# Patient Record
Sex: Male | Born: 1990 | Race: Asian | Hispanic: No | Marital: Single
Health system: Southern US, Community
[De-identification: ages and names within clinical notes are randomized; demographics above are authoritative.]

---

## 2019-02-27 ENCOUNTER — Other Ambulatory Visit: Payer: Self-pay | Admitting: Family Medicine

## 2019-02-27 DIAGNOSIS — R748 Abnormal levels of other serum enzymes: Secondary | ICD-10-CM

## 2019-03-02 ENCOUNTER — Other Ambulatory Visit: Payer: Self-pay | Admitting: Family Medicine

## 2019-03-07 ENCOUNTER — Other Ambulatory Visit: Payer: Self-pay | Admitting: Family Medicine

## 2019-03-09 ENCOUNTER — Ambulatory Visit
Admission: RE | Admit: 2019-03-09 | Discharge: 2019-03-09 | Disposition: A | Discharge status: Home / Self Care | Payer: BLUE CROSS/BLUE SHIELD | Source: Ambulatory Visit | Attending: Family Medicine | Admitting: Family Medicine

## 2019-03-09 DIAGNOSIS — R748 Abnormal levels of other serum enzymes: Secondary | ICD-10-CM

## 2019-09-14 ENCOUNTER — Other Ambulatory Visit: Payer: Self-pay | Admitting: Family Medicine

## 2019-09-14 DIAGNOSIS — R748 Abnormal levels of other serum enzymes: Secondary | ICD-10-CM

## 2019-09-25 ENCOUNTER — Ambulatory Visit
Admission: RE | Admit: 2019-09-25 | Discharge: 2019-09-25 | Disposition: A | Discharge status: Home / Self Care | Payer: BLUE CROSS/BLUE SHIELD | Source: Ambulatory Visit | Attending: Family Medicine | Admitting: Family Medicine

## 2019-09-25 DIAGNOSIS — R748 Abnormal levels of other serum enzymes: Secondary | ICD-10-CM

## 2019-12-27 ENCOUNTER — Ambulatory Visit: Payer: BLUE CROSS/BLUE SHIELD | Attending: Family

## 2019-12-27 DIAGNOSIS — Z23 Encounter for immunization: Secondary | ICD-10-CM

## 2019-12-27 NOTE — Progress Notes (Signed)
   Covid-[facility] Vaccination [facility]  Name:  Damian Buckles    MRN: [id number] DOB: 09-14-1991  12/27/2019  Mr. [name] was observed post Covid-19 immunization for 15 minutes without incident. He was provided with Vaccine Information Sheet and instruction to access the V-Safe system.   Mr. Bamba was instructed to call 911 with any severe reactions post vaccine: Marland Kitchen Difficulty breathing  . Swelling of face and throat  . A fast heartbeat  . A bad rash all over body  . Dizziness and weakness   Immunizations Administered    Name Date Dose VIS Date Route   Moderna COVID-19 Vaccine 12/27/2019  1:42 PM 0.5 mL 09/11/2019 Intramuscular   Manufacturer: Moderna   Lot: [id number]   NDC: 66294-765-46

## 2020-01-29 ENCOUNTER — Ambulatory Visit: Payer: BLUE CROSS/BLUE SHIELD | Attending: Family

## 2020-01-29 DIAGNOSIS — Z23 Encounter for immunization: Secondary | ICD-10-CM

## 2020-01-29 NOTE — Progress Notes (Signed)
   Covid-19 Vaccination Clinic  Name:  Adrian Barnes    MRN: [id number] DOB: November 17, 1990  01/29/2020  Mr. [name] was observed post Covid-19 immunization for 15 minutes without incident. He was provided with Vaccine Information Sheet and instruction to access the V-Safe system.   Mr. [name] was instructed to call 911 with any severe reactions post vaccine: Marland Kitchen Difficulty breathing  . Swelling of face and throat  . A fast heartbeat  . A bad rash all over body  . Dizziness and weakness   Immunizations Administered    Name Date Dose VIS Date Route   Moderna COVID-19 Vaccine 01/29/2020 11:44 AM 0.5 mL 09/2019 Intramuscular   Manufacturer: Moderna   Lot: [id number]   NDC: 97989-211-94

## 2020-06-01 IMAGING — US ULTRASOUND ABDOMEN LIMITED
1 series · 14 of 25 positions shown · non-contrast
Comparison: None.

CLINICAL DATA: Elevated LFTs

EXAM:
ULTRASOUND ABDOMEN LIMITED RIGHT UPPER QUADRANT

[Series 1: ultrasound abdomen limited · 0.20mm/px · 14 of 46 slices shown]
[im 1/46]
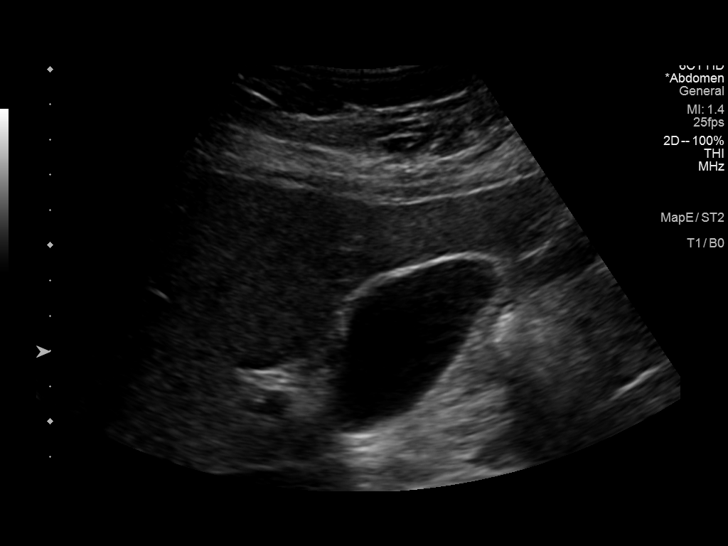
[im 4/46]
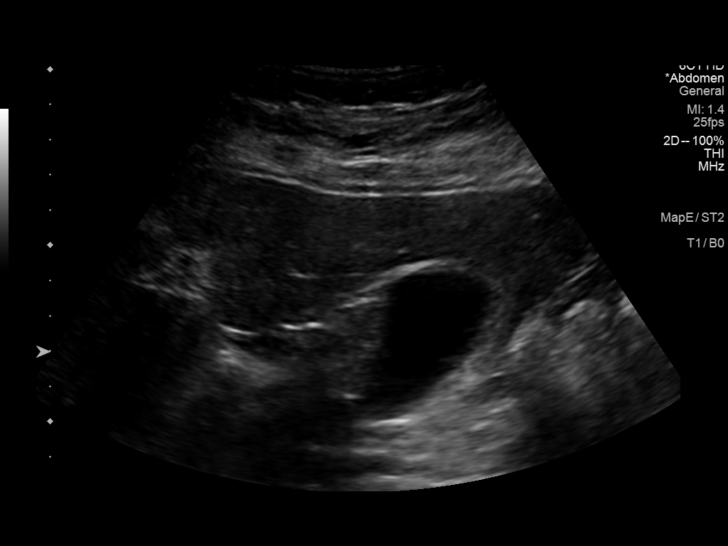
[im 8/46]
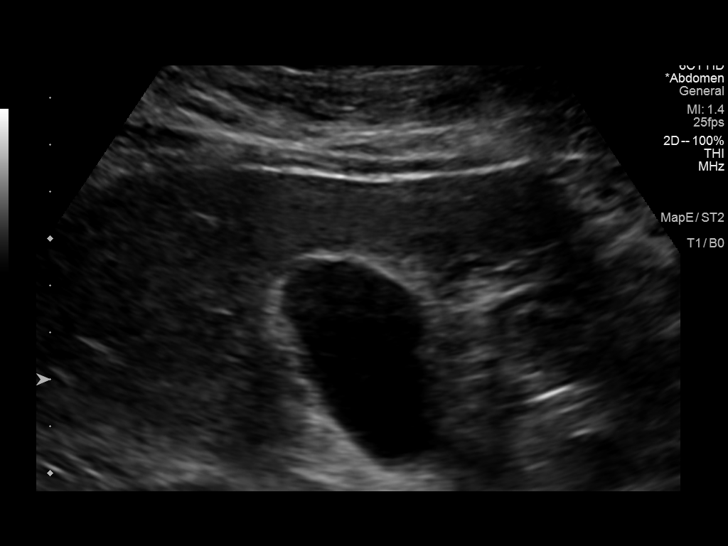
[im 12/46]
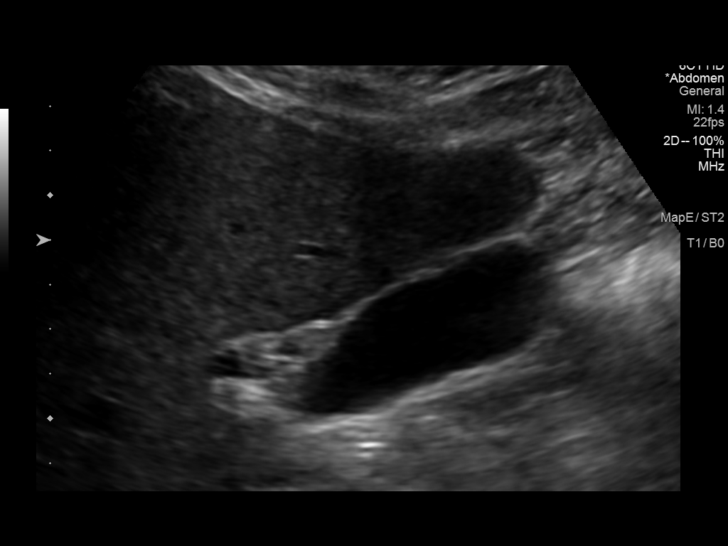
[im 16/46]
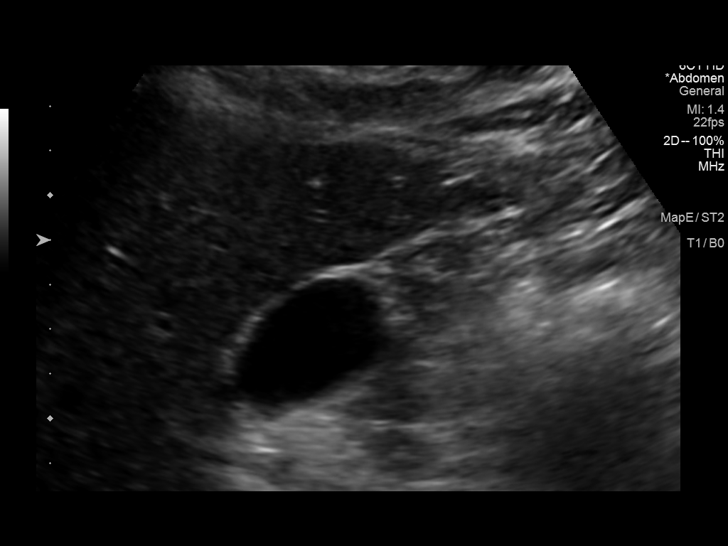
[im 17/46]
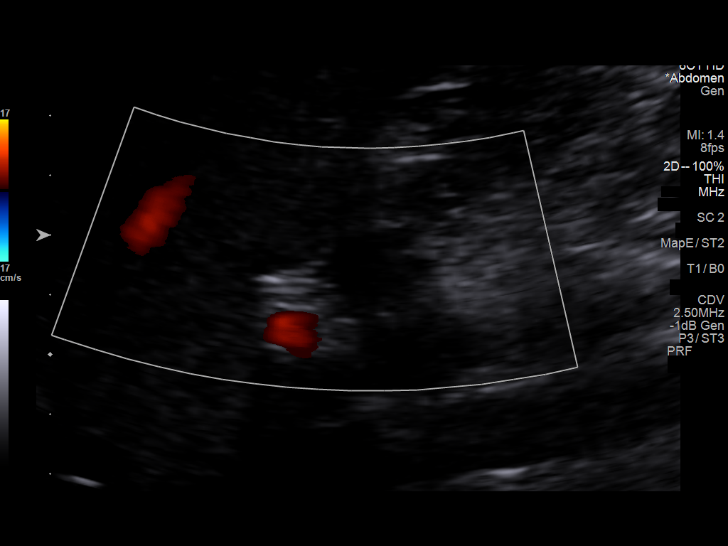
[im 21/46]
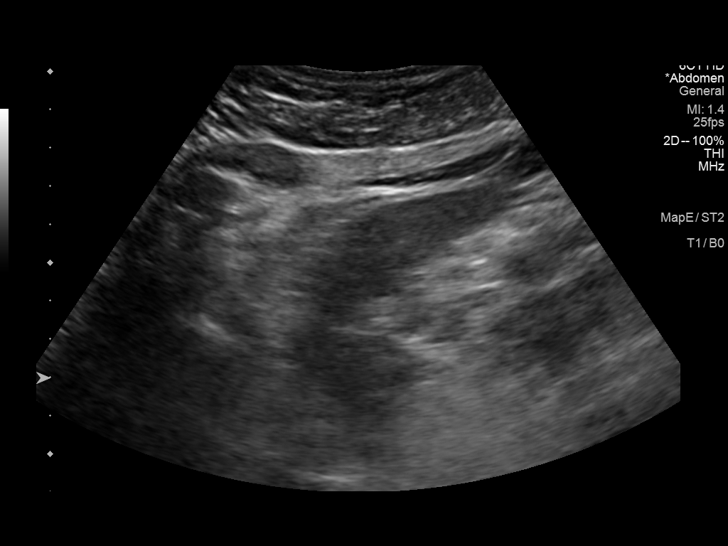
[im 25/46]
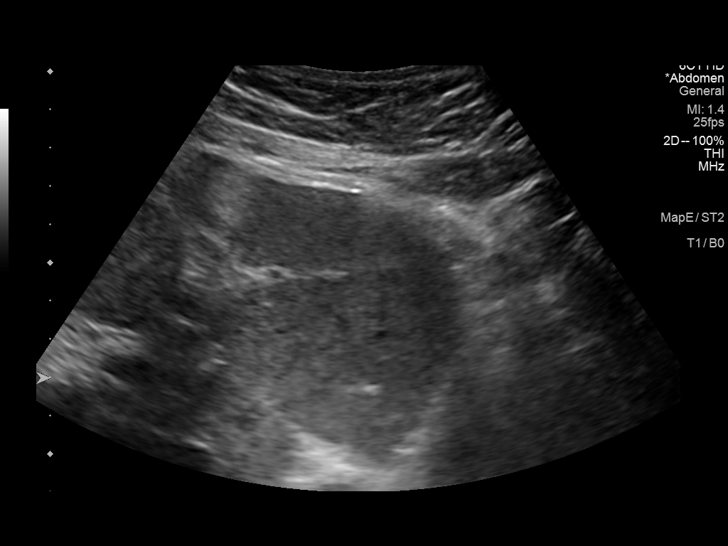
[im 29/46]
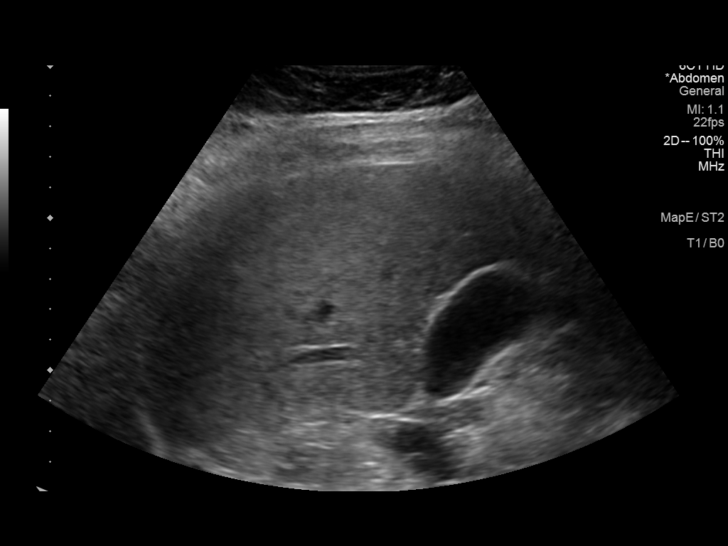
[im 31/46]
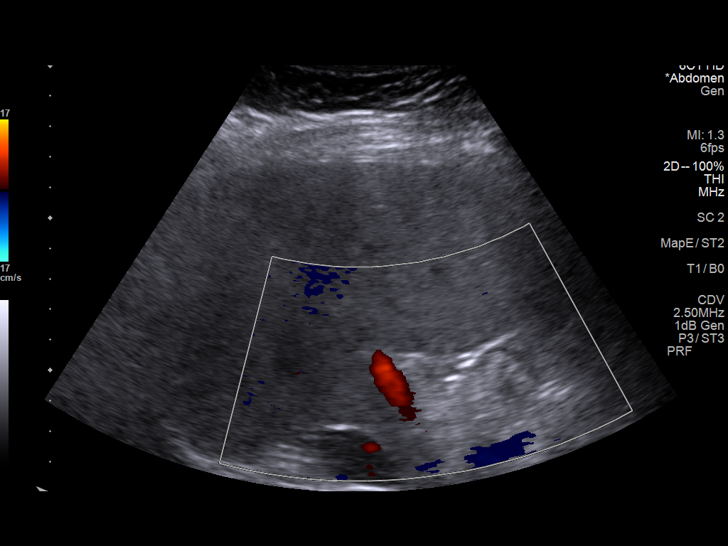
[im 34/46]
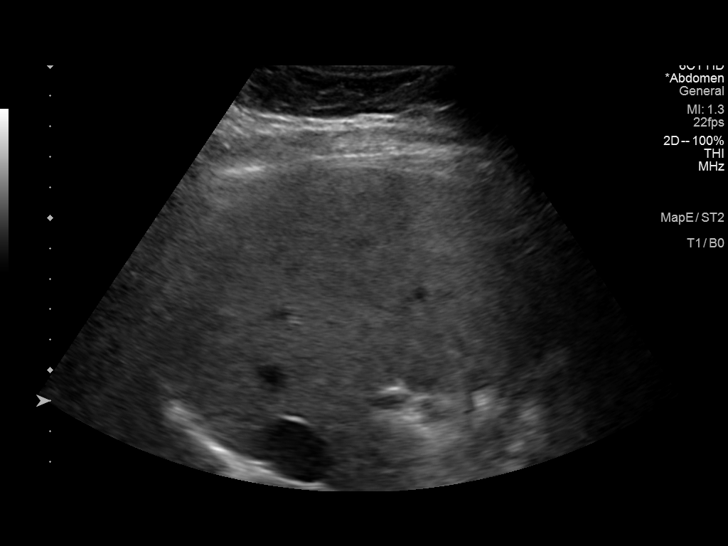
[im 38/46]
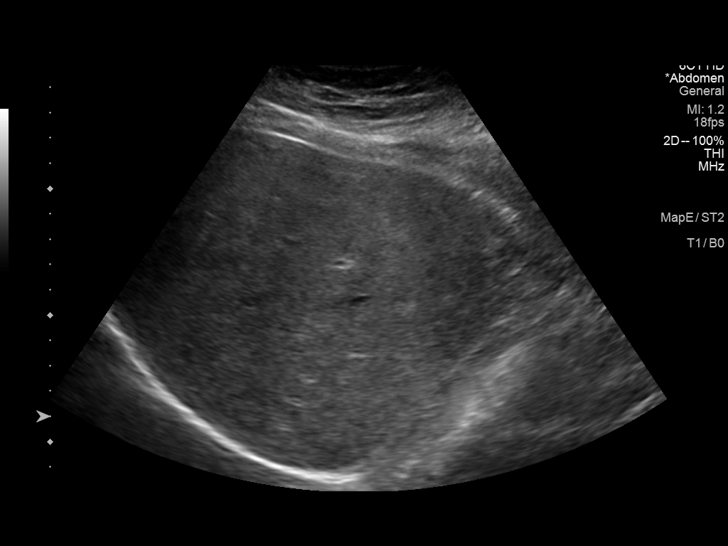
[im 42/46]
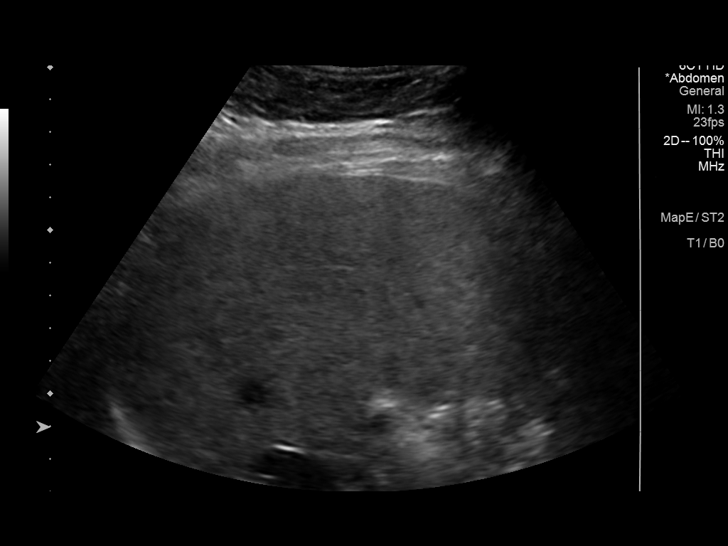
[im 46/46]
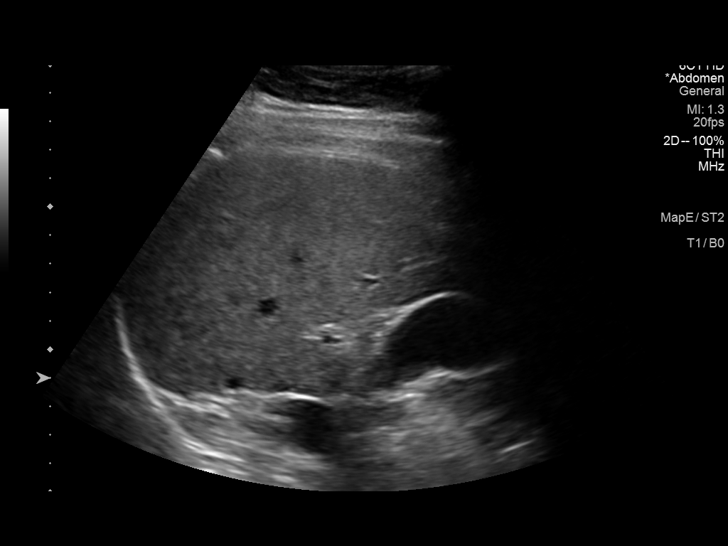

[14 of 25 positions shown; findings below may reference images not displayed]

FINDINGS: Gallbladder:

No gallstones or wall thickening visualized. No sonographic Murphy
sign noted by sonographer.

Common bile duct:

Diameter: 2.1 mm

Liver:

Anterior left hepatic lobe demonstrates subcapsular
well-circumscribed hyperechoic lesion measuring 1.5 x 1.7 x 1.3 cm
suggestive of hemangioma. No other hepatic abnormality. No biliary
dilatation. No surrounding free fluid or ascites.

Portal vein is patent on color Doppler imaging with normal direction
of blood flow towards the liver.
IMPRESSION: Negative for gallstones or biliary dilatation.

No acute finding by ultrasound

1.7 cm anterior left hepatic lobe hyperechoic lesion, favored to
represent hemangioma. Recommend follow-up imaging in 6 months to
document stability.

## 2020-12-18 IMAGING — US US ABDOMEN LIMITED
1 series · 14 of 25 positions shown · non-contrast
Comparison: March 09, 2019.

CLINICAL DATA: Elevated liver enzymes

EXAM:
ULTRASOUND ABDOMEN LIMITED RIGHT UPPER QUADRANT

[Series 1: us abdomen limited · 0.20mm/px · 14 of 48 slices shown]
[im 1/48]
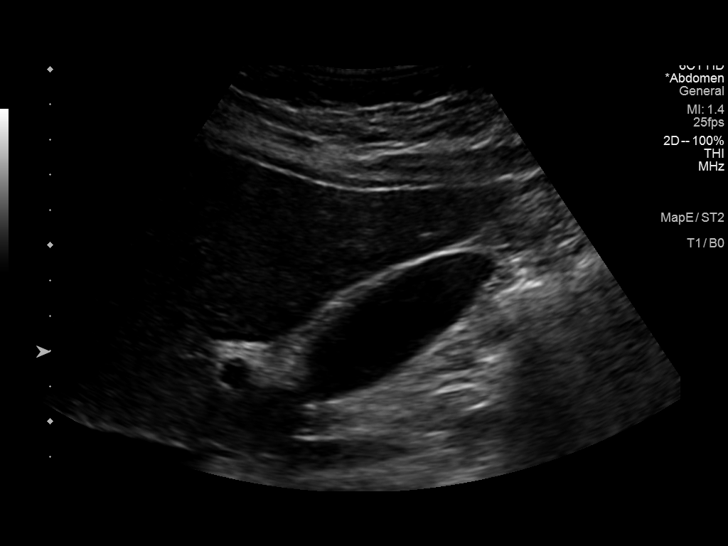
[im 4/48]
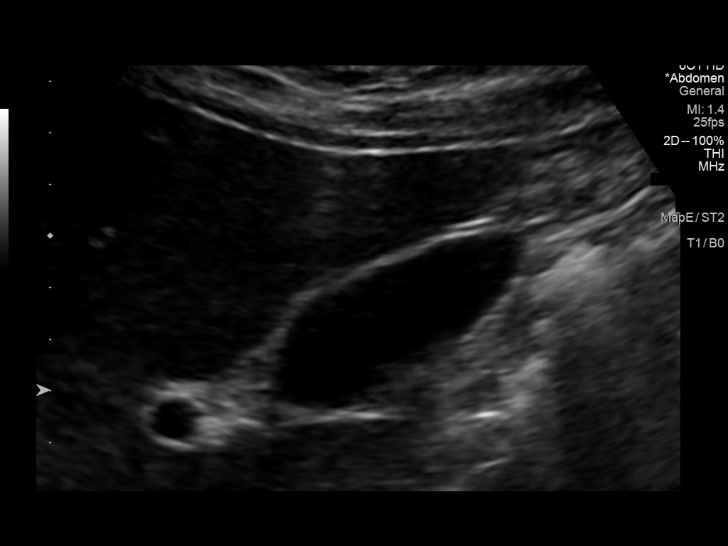
[im 8/48]
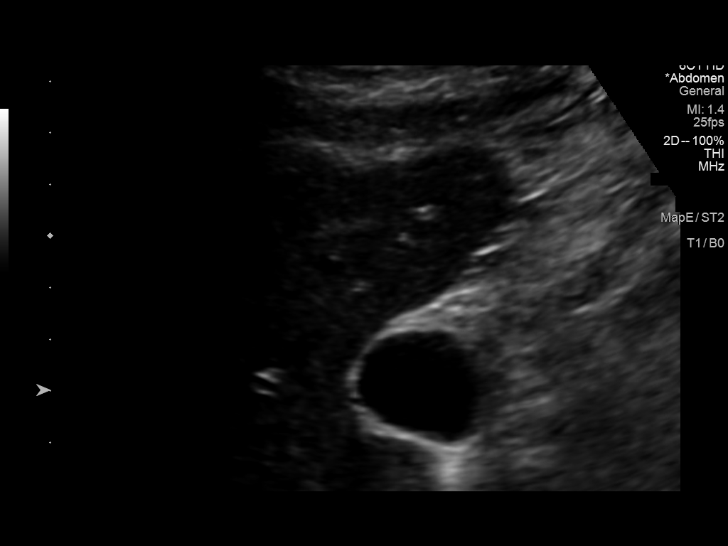
[im 12/48]
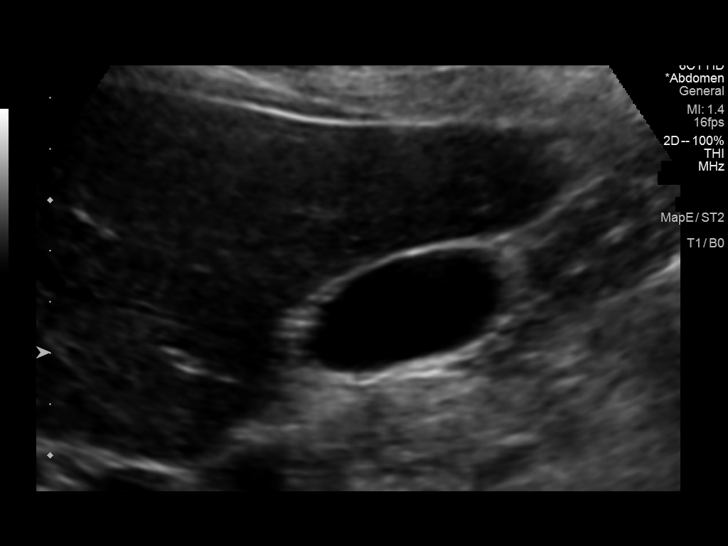
[im 16/48]
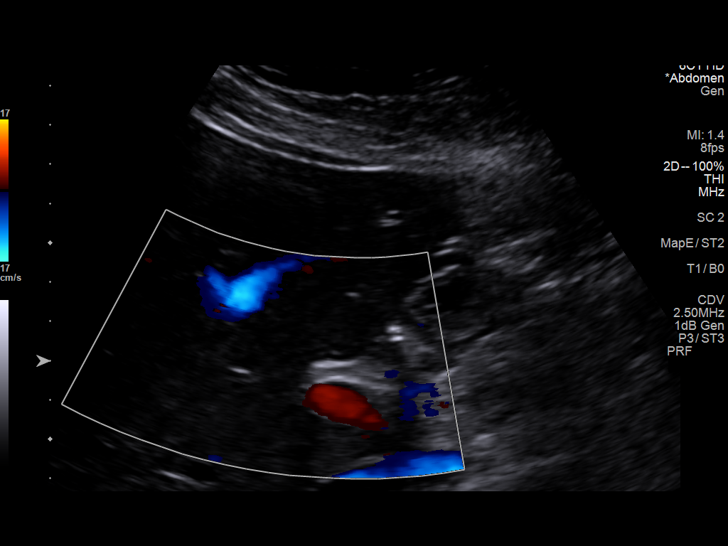
[im 18/48]
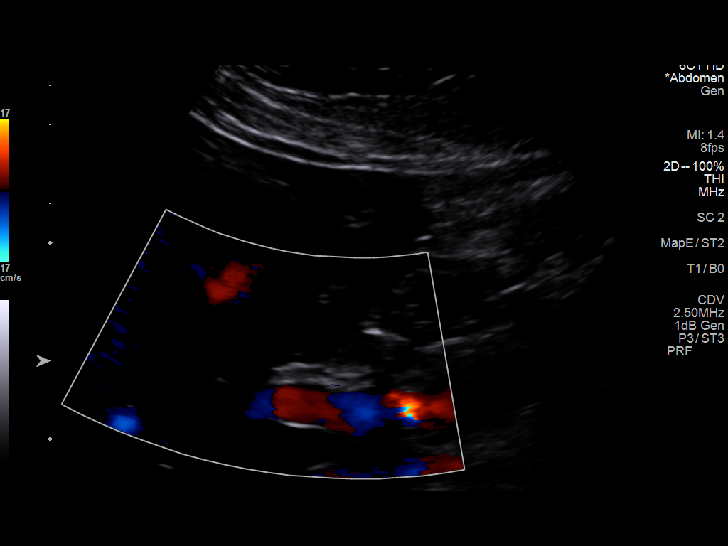
[im 22/48]
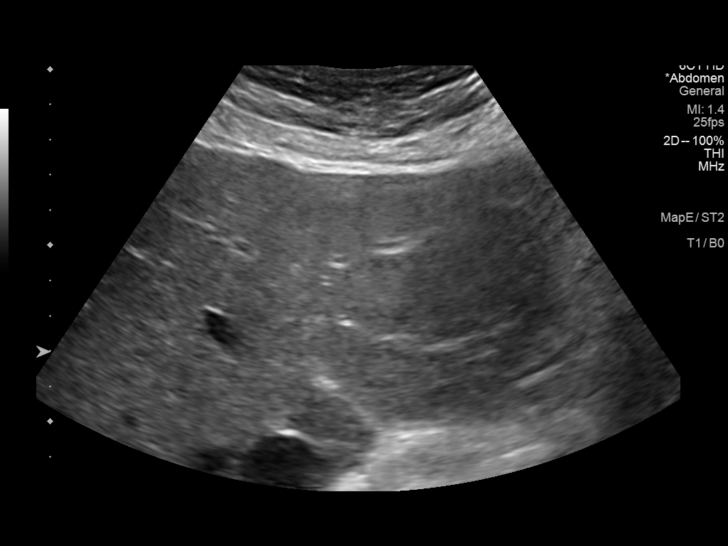
[im 26/48]
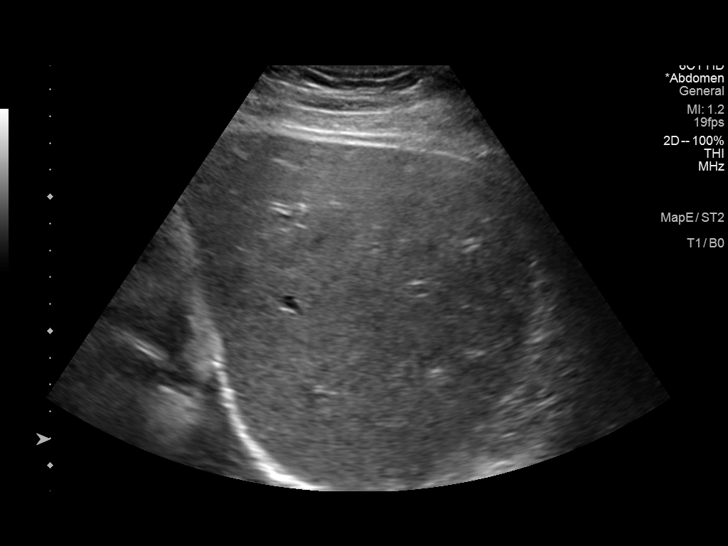
[im 30/48]
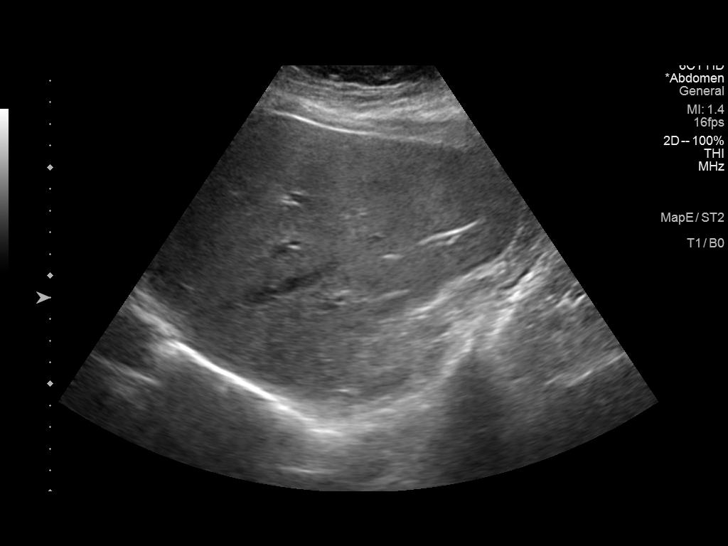
[im 32/48]
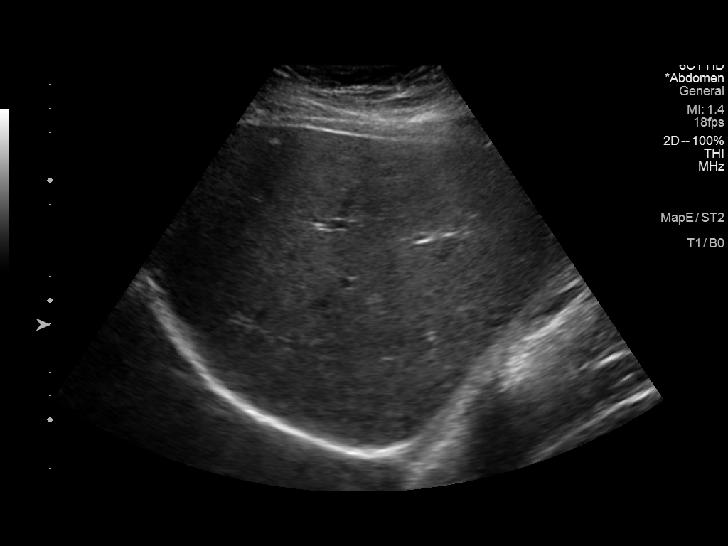
[im 36/48]
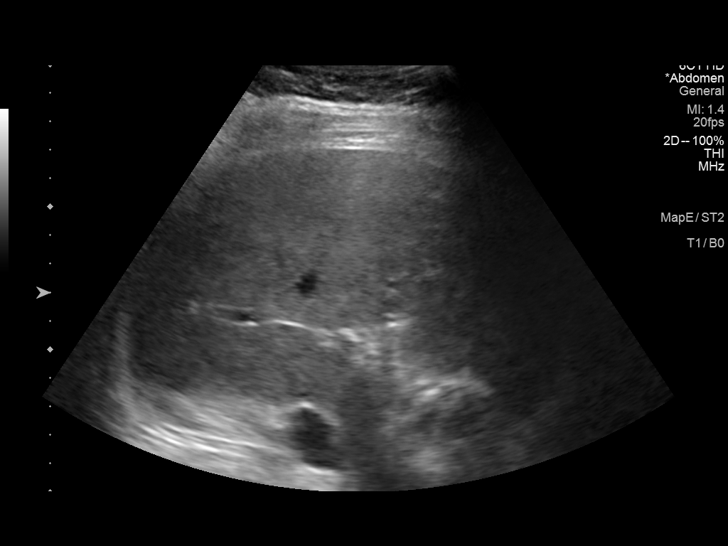
[im 40/48]
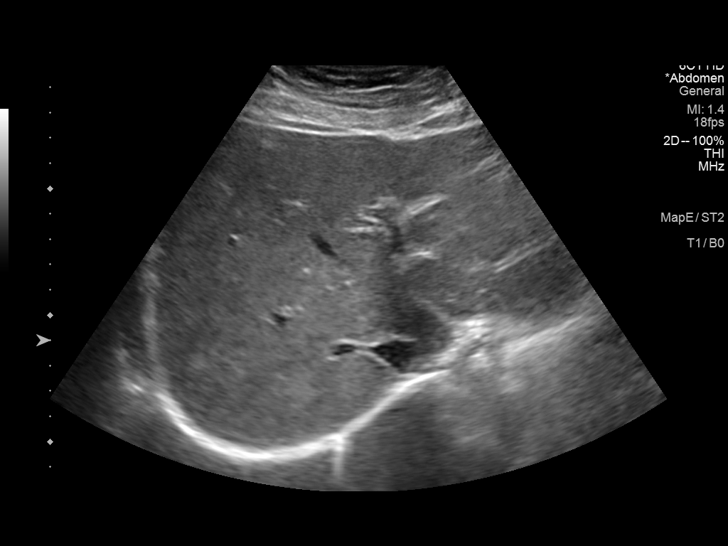
[im 44/48]
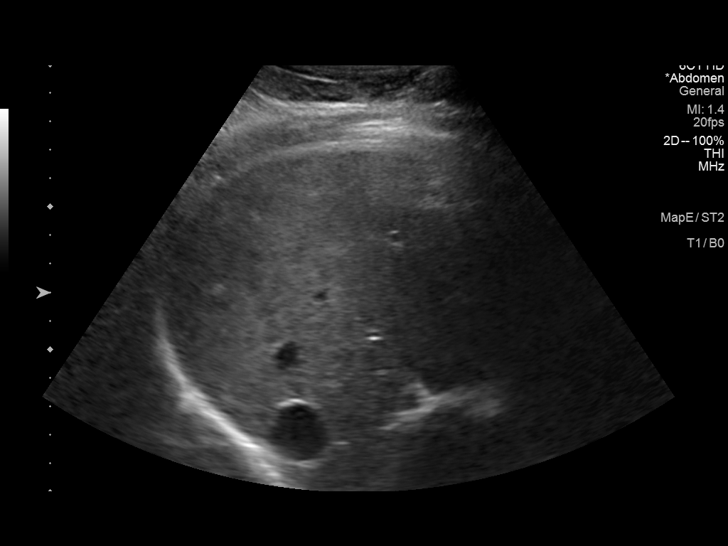
[im 48/48]
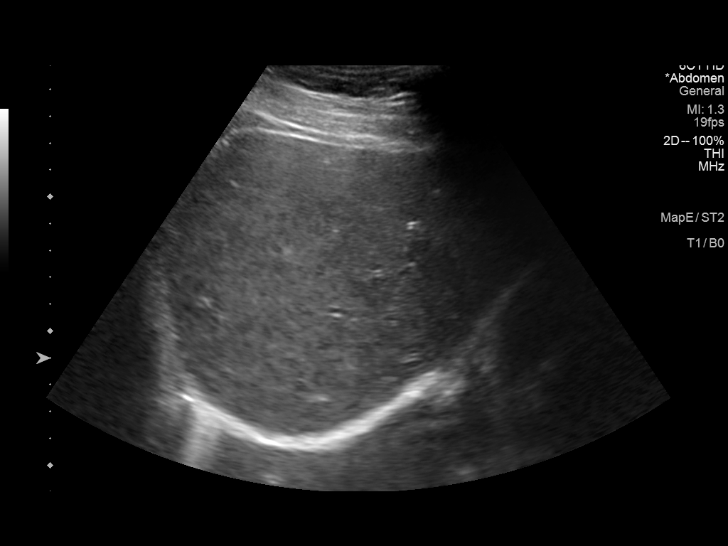

[14 of 25 positions shown; findings below may reference images not displayed]

FINDINGS: Gallbladder:

No gallstones or wall thickening visualized. There is no
pericholecystic fluid. No sonographic Murphy sign noted by
sonographer.

Common bile duct:

Diameter: 3 mm. No intrahepatic or extrahepatic biliary duct
dilatation.

Liver:

No focal lesion identified. Note that the previously noted echogenic
focus in the left lobe of the liver is no longer appreciable as a
discrete lesion. There is slight increased echogenicity in the liver
in this area. Liver otherwise within normal limits in parenchymal
echogenicity. Portal vein is patent on color Doppler imaging with
normal direction of blood flow towards the liver.

Other: None.
IMPRESSION: 1. The previously noted echogenic focus in the left lobe of the
liver is no longer appreciable as a discrete lesion. Question mild
fatty infiltration in this area with less fat in this area compared
to the previous study. No liver masses are evident by current
sonographic assessment.

2.  Study otherwise unremarkable.
